# Patient Record
Sex: Male | Born: 1995 | Marital: Single | Smoking: Never smoker
Health system: Southern US, Community
[De-identification: ages and names within clinical notes are randomized; demographics above are authoritative.]

## PROBLEM LIST (undated history)

## (undated) ENCOUNTER — Ambulatory Visit (HOSPITAL_COMMUNITY): Discharge status: Absent without leave | Payer: Self-pay

## (undated) DIAGNOSIS — S022XXA Fracture of nasal bones, initial encounter for closed fracture: Secondary | ICD-10-CM

## (undated) DIAGNOSIS — E785 Hyperlipidemia, unspecified: Secondary | ICD-10-CM

## (undated) HISTORY — PX: KNEE SURGERY: SHX244

## (undated) HISTORY — DX: Hyperlipidemia, unspecified: E78.5

---
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
## 2016-06-23 ENCOUNTER — Encounter: Payer: Self-pay | Admitting: Internal Medicine

## 2016-06-23 ENCOUNTER — Ambulatory Visit (INDEPENDENT_AMBULATORY_CARE_PROVIDER_SITE_OTHER): Payer: Self-pay | Admitting: Internal Medicine

## 2016-06-23 VITALS — BP 102/78 | HR 72 | Resp 12 | Ht 69.0 in | Wt 184.0 lb

## 2016-06-23 DIAGNOSIS — E663 Overweight: Secondary | ICD-10-CM

## 2016-06-23 DIAGNOSIS — L858 Other specified epidermal thickening: Secondary | ICD-10-CM

## 2016-06-23 DIAGNOSIS — K029 Dental caries, unspecified: Secondary | ICD-10-CM

## 2016-06-23 DIAGNOSIS — L309 Dermatitis, unspecified: Secondary | ICD-10-CM | POA: Insufficient documentation

## 2016-06-23 MED ORDER — TRIAMCINOLONE ACETONIDE 0.1 % EX CREA: 1.0000 "application " | TOPICAL_CREAM | Freq: Two times a day (BID) | CUTANEOUS | 2 refills | Status: AC

## 2016-06-23 NOTE — Patient Instructions (Addendum)
Drink a glass of water before every meal Drink 6-8 glasses of water daily Eat three meals daily Eat a protein and healthy fat with every meal (eggs,fish, chicken, [place] and limit red meats) Eat 5 servings of vegetables daily, mix the colors Eat 2 servings of fruit daily with skin, if skin is edible Use smaller plates Put food/utensils down as you chew and swallow each bite Eat at a table with friends/family at least once daily, no TV Do not eat in front of the TV  Eucerin Cream for eczema relief
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
## 2016-09-22 ENCOUNTER — Other Ambulatory Visit (INDEPENDENT_AMBULATORY_CARE_PROVIDER_SITE_OTHER): Payer: Self-pay

## 2016-09-22 DIAGNOSIS — Z13220 Encounter for screening for lipoid disorders: Secondary | ICD-10-CM

## 2016-09-22 DIAGNOSIS — E663 Overweight: Secondary | ICD-10-CM

## 2016-09-23 LAB — LIPID PANEL W/O CHOL/HDL RATIO
Cholesterol, Total: 223 mg/dL — ABNORMAL HIGH (ref 100–199)
HDL: 58 mg/dL (ref >39)
LDL Calculated: 133 mg/dL — ABNORMAL HIGH (ref 0–99)
TRIGLYCERIDES: 160 mg/dL — AB (ref 0–149)
VLDL Cholesterol Cal: 32 mg/dL (ref 5–40)

## 2016-09-23 LAB — CBC WITH DIFFERENTIAL/PLATELET
Basophils Absolute: 0 10*3/uL (ref 0.0–0.2)
Basos: 1 %
EOS (ABSOLUTE): 0.2 10*3/uL (ref 0.0–0.4)
EOS: 3 %
HEMATOCRIT: 41 % (ref 37.5–51.0)
HEMOGLOBIN: 13.3 g/dL (ref 13.0–17.7)
IMMATURE GRANULOCYTES: 0 %
Immature Grans (Abs): 0 10*3/uL (ref 0.0–0.1)
Lymphocytes Absolute: 2.7 10*3/uL (ref 0.7–3.1)
Lymphs: 46 %
MCH: 29.6 pg (ref 26.6–33.0)
MCHC: 32.4 g/dL (ref 31.5–35.7)
MCV: 91 fL (ref 79–97)
Monocytes Absolute: 0.4 10*3/uL (ref 0.1–0.9)
Monocytes: 7 %
Neutrophils Absolute: 2.5 10*3/uL (ref 1.4–7.0)
Neutrophils: 43 %
Platelets: 278 10*3/uL (ref 150–379)
RBC: 4.5 x10E6/uL (ref 4.14–5.80)
RDW: 13.5 % (ref 12.3–15.4)
WBC: 5.9 10*3/uL (ref 3.4–10.8)

## 2016-09-23 LAB — COMPREHENSIVE METABOLIC PANEL
ALBUMIN: 4.5 g/dL (ref 3.5–5.5)
ALT: 34 IU/L (ref 0–44)
AST: 28 IU/L (ref 0–40)
Albumin/Globulin Ratio: 1.9 (ref 1.2–2.2)
Alkaline Phosphatase: 72 IU/L (ref 39–117)
BUN / CREAT RATIO: 15 (ref 9–20)
BUN: 16 mg/dL (ref 6–20)
Bilirubin Total: 0.4 mg/dL (ref 0.0–1.2)
CALCIUM: 9.4 mg/dL (ref 8.7–10.2)
CO2: 25 mmol/L (ref 20–29)
CREATININE: 1.1 mg/dL (ref 0.76–1.27)
Chloride: 103 mmol/L (ref 96–106)
GFR calc Af Amer: 110 mL/min/{1.73_m2} (ref >59)
GFR, EST NON AFRICAN AMERICAN: 95 mL/min/{1.73_m2} (ref >59)
GLOBULIN, TOTAL: 2.4 g/dL (ref 1.5–4.5)
GLUCOSE: 88 mg/dL (ref 65–99)
Potassium: 5.2 mmol/L (ref 3.5–5.2)
SODIUM: 143 mmol/L (ref 134–144)
Total Protein: 6.9 g/dL (ref 6.0–8.5)

## 2016-09-26 ENCOUNTER — Ambulatory Visit (INDEPENDENT_AMBULATORY_CARE_PROVIDER_SITE_OTHER): Payer: Self-pay | Admitting: Internal Medicine

## 2016-09-26 ENCOUNTER — Encounter: Payer: Self-pay | Admitting: Internal Medicine

## 2016-09-26 VITALS — BP 118/82 | HR 72 | Resp 12 | Ht 68.0 in | Wt 180.0 lb

## 2016-09-26 DIAGNOSIS — E782 Mixed hyperlipidemia: Secondary | ICD-10-CM

## 2016-09-26 DIAGNOSIS — L858 Other specified epidermal thickening: Secondary | ICD-10-CM

## 2016-09-26 DIAGNOSIS — K2970 Gastritis, unspecified, without bleeding: Secondary | ICD-10-CM

## 2016-09-26 DIAGNOSIS — K029 Dental caries, unspecified: Secondary | ICD-10-CM

## 2016-09-26 DIAGNOSIS — Z0000 Encounter for general adult medical examination without abnormal findings: Secondary | ICD-10-CM

## 2016-09-26 DIAGNOSIS — E785 Hyperlipidemia, unspecified: Secondary | ICD-10-CM | POA: Insufficient documentation

## 2016-09-26 DIAGNOSIS — L309 Dermatitis, unspecified: Secondary | ICD-10-CM

## 2016-09-26 HISTORY — DX: Hyperlipidemia, unspecified: E78.5

## 2016-09-26 MED ORDER — FAMOTIDINE 20 MG PO TABS: ORAL_TABLET | ORAL | 1 refills | Status: AC

## 2016-09-26 NOTE — Patient Instructions (Signed)
Can google "advance directives, [place]"  And bring up form from Secretary of State. Print and fill out Or can go to "5 wishes"  Which is also in Spanish and fill out--this costs $5--perhaps easier to use. Designate a Medical Power of Attorney to speak for you if you are unable to speak for yourself when ill or injured  Drink a glass of water before every meal Drink 6-8 glasses of water daily Eat three meals daily Eat a protein and healthy fat with every meal (eggs,fish, chicken, turkey and limit red meats) Eat 5 servings of vegetables daily, mix the colors Eat 2 servings of fruit daily with skin, if skin is edible Use smaller plates Put food/utensils down as you chew and swallow each bite Eat at a table with friends/family at least once daily, no TV Do not eat in front of the TV 
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
## 2016-09-27 LAB — HIV ANTIBODY (ROUTINE TESTING W REFLEX): HIV Screen 4th Generation wRfx: NONREACTIVE

## 2017-01-26 ENCOUNTER — Other Ambulatory Visit: Payer: Self-pay
